# Patient Record
Sex: Male | Born: 1965
Health system: Southern US, Community
[De-identification: ages and names within clinical notes are randomized; demographics above are authoritative.]

---

## 1987-05-12 HISTORY — PX: OTHER SURGICAL HISTORY: SHX169

## 2008-10-19 ENCOUNTER — Emergency Department (HOSPITAL_COMMUNITY): Admission: EM | Admit: 2008-10-19 | Discharge: 2008-10-19 | Payer: Self-pay | Admitting: Emergency Medicine

## 2010-08-18 LAB — URINE MICROSCOPIC-ADD ON

## 2010-08-18 LAB — CBC
HCT: 48.1 % (ref 39.0–52.0)
MCHC: 34.5 g/dL (ref 30.0–36.0)
MCV: 88.7 fL (ref 78.0–100.0)
RBC: 5.43 MIL/uL (ref 4.22–5.81)
WBC: 11.1 10*3/uL — ABNORMAL HIGH (ref 4.0–10.5)

## 2010-08-18 LAB — DIFFERENTIAL
Basophils Absolute: 0 10*3/uL (ref 0.0–0.1)
Lymphocytes Relative: 7 % — ABNORMAL LOW (ref 12–46)
Lymphs Abs: 0.7 10*3/uL (ref 0.7–4.0)
Neutro Abs: 9.8 10*3/uL — ABNORMAL HIGH (ref 1.7–7.7)
Neutrophils Relative %: 88 % — ABNORMAL HIGH (ref 43–77)

## 2010-08-18 LAB — PROTIME-INR
INR: 1.1 (ref 0.00–1.49)
Prothrombin Time: 14.1 seconds (ref 11.6–15.2)

## 2010-08-18 LAB — COMPREHENSIVE METABOLIC PANEL
AST: 27 U/L (ref 0–37)
BUN: 20 mg/dL (ref 6–23)
CO2: 27 mEq/L (ref 19–32)
Calcium: 9.6 mg/dL (ref 8.4–10.5)
Chloride: 107 mEq/L (ref 96–112)
Creatinine, Ser: 1 mg/dL (ref 0.4–1.5)
GFR calc Af Amer: >60 mL/min (ref >60)
GFR calc non Af Amer: >60 mL/min (ref >60)
Total Bilirubin: 1.1 mg/dL (ref 0.3–1.2)

## 2010-08-18 LAB — URINALYSIS, ROUTINE W REFLEX MICROSCOPIC
Glucose, UA: NEGATIVE mg/dL
Ketones, ur: >80 mg/dL — AB
Nitrite: NEGATIVE
pH: 6.5 (ref 5.0–8.0)

## 2012-04-19 ENCOUNTER — Other Ambulatory Visit: Payer: Self-pay | Admitting: Internal Medicine

## 2012-04-19 ENCOUNTER — Ambulatory Visit
Admission: RE | Admit: 2012-04-19 | Discharge: 2012-04-19 | Disposition: A | Discharge status: Home / Self Care | Payer: BC Managed Care – PPO | Source: Ambulatory Visit | Attending: Internal Medicine | Admitting: Internal Medicine

## 2012-04-19 DIAGNOSIS — M25511 Pain in right shoulder: Secondary | ICD-10-CM

## 2012-04-22 ENCOUNTER — Other Ambulatory Visit: Payer: BC Managed Care – PPO

## 2012-04-25 ENCOUNTER — Ambulatory Visit
Payer: BC Managed Care – PPO | Attending: Internal Medicine | Admitting: Rehabilitative and Restorative Service Providers"

## 2012-04-25 DIAGNOSIS — M25619 Stiffness of unspecified shoulder, not elsewhere classified: Secondary | ICD-10-CM | POA: Insufficient documentation

## 2012-04-25 DIAGNOSIS — IMO0001 Reserved for inherently not codable concepts without codable children: Secondary | ICD-10-CM | POA: Insufficient documentation

## 2012-04-25 DIAGNOSIS — M25519 Pain in unspecified shoulder: Secondary | ICD-10-CM | POA: Insufficient documentation

## 2012-04-28 ENCOUNTER — Ambulatory Visit: Payer: BC Managed Care – PPO | Admitting: Physical Therapy

## 2012-05-05 ENCOUNTER — Ambulatory Visit: Payer: BC Managed Care – PPO

## 2012-05-05 ENCOUNTER — Ambulatory Visit: Payer: BC Managed Care – PPO | Admitting: Rehabilitation

## 2012-05-09 ENCOUNTER — Ambulatory Visit: Payer: BC Managed Care – PPO | Admitting: Rehabilitation

## 2012-05-12 ENCOUNTER — Ambulatory Visit: Payer: BC Managed Care – PPO | Admitting: Rehabilitative and Restorative Service Providers"

## 2012-05-12 ENCOUNTER — Ambulatory Visit: Payer: BC Managed Care – PPO | Admitting: Rehabilitation

## 2012-05-12 ENCOUNTER — Encounter: Payer: BC Managed Care – PPO | Admitting: Rehabilitation

## 2012-09-01 ENCOUNTER — Ambulatory Visit (INDEPENDENT_AMBULATORY_CARE_PROVIDER_SITE_OTHER): Payer: BC Managed Care – PPO | Admitting: Sports Medicine

## 2012-09-01 VITALS — BP 100/60 | Ht 69.0 in | Wt 150.0 lb

## 2012-09-01 DIAGNOSIS — M75101 Unspecified rotator cuff tear or rupture of right shoulder, not specified as traumatic: Secondary | ICD-10-CM

## 2012-09-01 DIAGNOSIS — M25519 Pain in unspecified shoulder: Secondary | ICD-10-CM

## 2012-09-01 DIAGNOSIS — M25511 Pain in right shoulder: Secondary | ICD-10-CM

## 2012-09-01 DIAGNOSIS — S43429A Sprain of unspecified rotator cuff capsule, initial encounter: Secondary | ICD-10-CM

## 2012-09-01 MED ORDER — NITROGLYCERIN 0.2 MG/HR TD PT24: MEDICATED_PATCH | TRANSDERMAL | Status: DC

## 2012-09-01 NOTE — Patient Instructions (Addendum)
Nitroglycerin Protocol   Apply 1/4 nitroglycerin patch to affected area daily.  Change position of patch within the affected area every 24 hours.  You may experience a headache during the first 1-2 weeks of using the patch, these should subside.  If you experience headaches after beginning nitroglycerin patch treatment, you may take your preferred over the counter pain reliever.  Another side effect of the nitroglycerin patch is skin irritation or rash related to patch adhesive.  Please notify our office if you develop more severe headaches or rash, and stop the patch.  Tendon healing with nitroglycerin patch may require 12 to 24 weeks depending on the extent of injury.  Men should not use if taking Viagra, Cialis, or Levitra.   Do not use if you have migraines or rosacea.    Please do suggested exercises daily  It is ok to continue playing tennis Try not to hit your serve or overheads too hard, try not to hit forehand late   Please follow up in 6 weeks  Thank you for seeing [place] today!

## 2012-09-06 DIAGNOSIS — M25519 Pain in unspecified shoulder: Secondary | ICD-10-CM | POA: Insufficient documentation

## 2012-09-06 DIAGNOSIS — M75100 Unspecified rotator cuff tear or rupture of unspecified shoulder, not specified as traumatic: Secondary | ICD-10-CM | POA: Insufficient documentation

## 2012-09-06 NOTE — Assessment & Plan Note (Signed)
Try on NTG protocol This looks to have potential for full healing  Avoid activities likely to worsen this - tennis serve; overhead lifts  rescan in 6 weeks

## 2012-09-06 NOTE — Assessment & Plan Note (Signed)
Use prn NSAIDs  HEP to maintain RC strength with low weights

## 2012-09-06 NOTE — Progress Notes (Signed)
Patient ID: Steven Andrade, male   DOB: Oct 23, 1965, 47 y.o.   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 Palpation is normal with no tenderness over AC joint or bicipital groove. ROM is full in all planes. Rotator cuff strength normal throughout except SUPRASPINATUS  3 + signs of impingement with pain on Neer and Hawkin's tests, empty can. Empty can is weak  Speeds and Yergason's tests normal. No labral pathology noted with negative Obrien's, negative clunk and good stability. Normal scapular function observed. No painful arc and no drop arm sign. No apprehension sign  MSK [place] There is a small tear in Supraspinatus Tendon of about 0.5 cm length This looks like partial thickness Other RC and Bicipital tendons are normal AC jont shows mild DJD

## 2012-09-20 ENCOUNTER — Encounter: Payer: Self-pay | Admitting: Oncology

## 2012-10-11 ENCOUNTER — Ambulatory Visit (INDEPENDENT_AMBULATORY_CARE_PROVIDER_SITE_OTHER): Payer: BC Managed Care – PPO | Admitting: Sports Medicine

## 2012-10-11 VITALS — BP 120/70 | Ht 69.0 in | Wt 150.0 lb

## 2012-10-11 DIAGNOSIS — M25511 Pain in right shoulder: Secondary | ICD-10-CM

## 2012-10-11 DIAGNOSIS — M25519 Pain in unspecified shoulder: Secondary | ICD-10-CM

## 2012-10-11 DIAGNOSIS — S43429A Sprain of unspecified rotator cuff capsule, initial encounter: Secondary | ICD-10-CM

## 2012-10-11 DIAGNOSIS — M75101 Unspecified rotator cuff tear or rupture of right shoulder, not specified as traumatic: Secondary | ICD-10-CM

## 2012-10-11 NOTE — Assessment & Plan Note (Signed)
This is showing significant signs of healing  Keep up nitroglycerin protocol for 6 more weeks  Keep up home exercise program  Okay to play tennis but ease into serving  Recheck with repeat scan in 6 weeks

## 2012-10-11 NOTE — Assessment & Plan Note (Signed)
Pain is much less and he is using very little ibuprofen

## 2012-10-11 NOTE — Progress Notes (Signed)
Patient ID: Steven Andrade, male   DOB: 1965/11/14, 47 y.o.   MRN: [id number]  Patient is seen 6 weeks after a rotator cuff tear involving the supraspinatous tendon on the right This looked to be a partial articular side tear He was started on home exercises Started on nitroglycerin protocol He has felt substantially better He is actually playing tennis he is just being careful on the serve and the overhead No night pain Feels that he has much better strength  Feels that he is at least 60% better  Physical examination  No acute distress Shoulder: Inspection reveals no abnormalities, atrophy or asymmetry. Palpation is normal with no tenderness over AC joint or bicipital groove. ROM is full in all planes. Rotator cuff strength normal throughout. No signs of impingement with negative Neer and Hawkin's tests, empty can. Speeds and Yergason's tests normal. No labral pathology noted with negative Obrien's, negative clunk and good stability. Normal scapular function observed. No painful arc and no drop arm sign. No apprehension sign  MSK ultrasound Compared to last visit there is a reduction about 40% in the width of the small tear This measures about 0.5 cm in length This looks less hypoechoic On dynamic imaging I do not see any gapping of the tear

## 2012-11-23 ENCOUNTER — Ambulatory Visit (INDEPENDENT_AMBULATORY_CARE_PROVIDER_SITE_OTHER): Payer: BC Managed Care – PPO | Admitting: Sports Medicine

## 2012-11-23 VITALS — BP 115/73 | Ht 68.0 in | Wt 142.0 lb

## 2012-11-23 DIAGNOSIS — S43429A Sprain of unspecified rotator cuff capsule, initial encounter: Secondary | ICD-10-CM

## 2012-11-23 DIAGNOSIS — M25519 Pain in unspecified shoulder: Secondary | ICD-10-CM

## 2012-11-23 DIAGNOSIS — M75101 Unspecified rotator cuff tear or rupture of right shoulder, not specified as traumatic: Secondary | ICD-10-CM

## 2012-11-23 DIAGNOSIS — M25511 Pain in right shoulder: Secondary | ICD-10-CM

## 2012-11-23 NOTE — Progress Notes (Signed)
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 Patient has been using the nitroglycerin protocol for 3 months now. He feels like he has had significant improvement at this time. He is actually feeling great today which he attributes to not playing tennis on Tuesday. He states that his instructor has pointed out some issues with his serving technique that likely led to his initial problem. On preliminary ultrasound, patient was found to have a partial tear of the supraspinatus tendon. He feels like his strength and range of motion is improving and he has had less pain  ROS: As above in the HPI. All other systems are stable or negative.  OBJECTIVE: APPEARANCE:  Patient in no acute distress.The patient appeared well nourished and normally developed. Respirations nonlabored. No rash HEENT: No scleral icterus. Conjunctiva non-injected MSK:  Right Shoulder - No swelling or deformity - No TTP over AC joint, biceps tendon, or lateral humerus - FROM in flexion, abduction, internal, external rotation - Strength 5/5 on shoulder abduction, internal rotation, external rotation, empty can - Negative empty can, Hawkin's, Neer's for impingement  Musculoskeletal ultrasound: Right shoulder: Previously visualized partial tear in the right supraspinatus tendon has now healed. There is good continuity of tendon fibers. Biceps tendon, infraspinatus, teres minor, subscapularis also visualized and healthy-appearing. There is no impingement on dynamic testing of the supraspinatus or subscapularis.  ASSESSMENT:  #1. Right supraspinatus partial tear, evidence of good healing on musculoskeletal exam in patient with clinical improvement  PLAN: Patient appears to have significant improvement at this time. He may discontinue the nitroglycerin protocol. Encouraged  him to continue his home exercises. Continue to work on improving tennis technique as this was likely the etiology of his injury.

## 2012-11-23 NOTE — Patient Instructions (Signed)
Decrease nitroglycerin to every other day until you run out and then stop. Continue to work on Engineer, site.

## 2012-11-24 NOTE — Assessment & Plan Note (Signed)
Pain level rarely above 2/10 now and only w tennis

## 2012-11-24 NOTE — Assessment & Plan Note (Signed)
apears healed with HEP and NTG  Reck prn

## 2014-02-06 ENCOUNTER — Other Ambulatory Visit: Payer: Self-pay | Admitting: Urology

## 2014-03-02 ENCOUNTER — Encounter (HOSPITAL_BASED_OUTPATIENT_CLINIC_OR_DEPARTMENT_OTHER): Payer: No Typology Code available for payment source | Admitting: Anesthesiology

## 2014-03-02 ENCOUNTER — Encounter (HOSPITAL_BASED_OUTPATIENT_CLINIC_OR_DEPARTMENT_OTHER)
Admission: RE | Disposition: A | Discharge status: Home / Self Care | Payer: Self-pay | Source: Ambulatory Visit | Attending: Urology

## 2014-03-02 ENCOUNTER — Ambulatory Visit (HOSPITAL_BASED_OUTPATIENT_CLINIC_OR_DEPARTMENT_OTHER): Payer: No Typology Code available for payment source | Admitting: Anesthesiology

## 2014-03-02 ENCOUNTER — Ambulatory Visit (HOSPITAL_BASED_OUTPATIENT_CLINIC_OR_DEPARTMENT_OTHER)
Admission: RE | Admit: 2014-03-02 | Discharge: 2014-03-02 | Disposition: A | Discharge status: Home / Self Care | Payer: No Typology Code available for payment source | Source: Ambulatory Visit | Attending: Urology | Admitting: Urology

## 2014-03-02 ENCOUNTER — Encounter (HOSPITAL_BASED_OUTPATIENT_CLINIC_OR_DEPARTMENT_OTHER): Payer: Self-pay

## 2014-03-02 DIAGNOSIS — N471 Phimosis: Secondary | ICD-10-CM | POA: Insufficient documentation

## 2014-03-02 HISTORY — PX: CIRCUMCISION: SHX1350

## 2014-03-02 LAB — POCT HEMOGLOBIN-HEMACUE: Hemoglobin: 18.2 g/dL — ABNORMAL HIGH (ref 13.0–17.0)

## 2014-03-02 SURGERY — CIRCUMCISION, ADULT
Anesthesia: Monitor Anesthesia Care | Site: Penis

## 2014-03-02 MED ORDER — CEFAZOLIN SODIUM-DEXTROSE 2-3 GM-% IV SOLR
2.0000 g | INTRAVENOUS | Status: AC
  Administered 2014-03-02: 2 g via INTRAVENOUS
  Filled 2014-03-02: qty 50

## 2014-03-02 MED ORDER — FENTANYL CITRATE 0.05 MG/ML IJ SOLN
INTRAMUSCULAR | Status: AC
  Filled 2014-03-02: qty 6

## 2014-03-02 MED ORDER — HYDROCODONE-ACETAMINOPHEN 5-325 MG PO TABS: 1.0000 | ORAL_TABLET | Freq: Four times a day (QID) | ORAL | Status: AC | PRN

## 2014-03-02 MED ORDER — CEFAZOLIN SODIUM-DEXTROSE 2-3 GM-% IV SOLR
INTRAVENOUS | Status: AC
  Filled 2014-03-02: qty 50

## 2014-03-02 MED ORDER — PROPOFOL 10 MG/ML IV EMUL
INTRAVENOUS | Status: DC | PRN
  Administered 2014-03-02: 120 ug/kg/min via INTRAVENOUS

## 2014-03-02 MED ORDER — FENTANYL CITRATE 0.05 MG/ML IJ SOLN
25.0000 ug | INTRAMUSCULAR | Status: DC | PRN
  Filled 2014-03-02: qty 1

## 2014-03-02 MED ORDER — LACTATED RINGERS IV SOLN
INTRAVENOUS | Status: DC
  Administered 2014-03-02: 07:00:00 via INTRAVENOUS
  Filled 2014-03-02: qty 1000

## 2014-03-02 MED ORDER — MIDAZOLAM HCL 5 MG/5ML IJ SOLN
INTRAMUSCULAR | Status: DC | PRN
  Administered 2014-03-02: 2 mg via INTRAVENOUS

## 2014-03-02 MED ORDER — BUPIVACAINE HCL (PF) 0.25 % IJ SOLN
INTRAMUSCULAR | Status: DC | PRN
  Administered 2014-03-02: 6.5 mL

## 2014-03-02 MED ORDER — MIDAZOLAM HCL 2 MG/2ML IJ SOLN
INTRAMUSCULAR | Status: AC
  Filled 2014-03-02: qty 2

## 2014-03-02 MED ORDER — LIDOCAINE HCL 1 % IJ SOLN
INTRAMUSCULAR | Status: DC | PRN
  Administered 2014-03-02: 6.5 mL

## 2014-03-02 MED ORDER — PROMETHAZINE HCL 25 MG/ML IJ SOLN
6.2500 mg | INTRAMUSCULAR | Status: DC | PRN
  Filled 2014-03-02: qty 1

## 2014-03-02 MED ORDER — BACITRACIN ZINC 500 UNIT/GM EX OINT
TOPICAL_OINTMENT | CUTANEOUS | Status: DC | PRN
  Administered 2014-03-02: 1 via TOPICAL

## 2014-03-02 MED ORDER — LIDOCAINE HCL (CARDIAC) 20 MG/ML IV SOLN
INTRAVENOUS | Status: DC | PRN
  Administered 2014-03-02: 50 mg via INTRAVENOUS

## 2014-03-02 SURGICAL SUPPLY — 33 items
BANDAGE CO FLEX L/F 1IN X 5YD (GAUZE/BANDAGES/DRESSINGS) ×2 IMPLANT
BANDAGE COBAN STERILE 2 (GAUZE/BANDAGES/DRESSINGS) ×3 IMPLANT
BLADE SURG 15 STRL LF DISP TIS (BLADE) ×1 IMPLANT
BLADE SURG 15 STRL SS (BLADE) ×3
CLOTH BEACON ORANGE TIMEOUT ST (SAFETY) ×3 IMPLANT
COVER MAYO STAND STRL (DRAPES) ×3 IMPLANT
COVER TABLE BACK 60X90 (DRAPES) ×3 IMPLANT
DRAPE PED LAPAROTOMY (DRAPES) ×3 IMPLANT
DRSG TEGADERM 2-3/8X2-3/4 SM (GAUZE/BANDAGES/DRESSINGS) IMPLANT
DRSG VASELINE 3X18 (GAUZE/BANDAGES/DRESSINGS) IMPLANT
ELECT NDL TIP 2.8 STRL (NEEDLE) ×1 IMPLANT
ELECT NEEDLE TIP 2.8 STRL (NEEDLE) ×3 IMPLANT
ELECT REM PT RETURN 9FT ADLT (ELECTROSURGICAL) ×3
ELECTRODE REM PT RTRN 9FT ADLT (ELECTROSURGICAL) ×1 IMPLANT
GAUZE PETROLATUM 1 X8 (GAUZE/BANDAGES/DRESSINGS) ×3 IMPLANT
GAUZE SPONGE 4X4 12PLY STRL LF (GAUZE/BANDAGES/DRESSINGS) IMPLANT
GAUZE VASELINE 1X8 (GAUZE/BANDAGES/DRESSINGS) ×2 IMPLANT
GLOVE BIO SURGEON STRL SZ7.5 (GLOVE) ×3 IMPLANT
GOWN STRL REUS W/ TWL XL LVL3 (GOWN DISPOSABLE) IMPLANT
GOWN STRL REUS W/TWL XL LVL3 (GOWN DISPOSABLE) ×6
GOWN W/2 COTTON TOWELS 2 STD (GOWNS) IMPLANT
NDL HYPO 25X1 1.5 SAFETY (NEEDLE) ×1 IMPLANT
NEEDLE HYPO 25X1 1.5 SAFETY (NEEDLE) ×3 IMPLANT
NS IRRIG 500ML POUR BTL (IV SOLUTION) ×3 IMPLANT
PACK BASIN DAY SURGERY FS (CUSTOM PROCEDURE TRAY) ×3 IMPLANT
PENCIL BUTTON HOLSTER BLD 10FT (ELECTRODE) ×3 IMPLANT
SUT VIC AB 5-0 P-3 18X BRD (SUTURE) ×1 IMPLANT
SUT VIC AB 5-0 P3 18 (SUTURE) ×3
SUT VICRYL 4-0 PS2 18IN ABS (SUTURE) ×7 IMPLANT
SYR CONTROL 10ML LL (SYRINGE) ×3 IMPLANT
TOWEL OR 17X24 6PK STRL BLUE (TOWEL DISPOSABLE) ×6 IMPLANT
TRAY DSU PREP LF (CUSTOM PROCEDURE TRAY) ×3 IMPLANT
WATER STERILE IRR 500ML POUR (IV SOLUTION) IMPLANT

## 2014-03-02 NOTE — Discharge Instructions (Signed)
Postoperative instructions for circumcision ° °Wound: ° °Remove the dressing the morning after surgery. °In most cases your incision will have absorbable sutures that run along the course of your incision and will dissolve within the first 10-20 days. Some will fall out even earlier. Expect some redness as the sutures dissolved but this should occur only around the sutures. If there is generalized redness, especially with increasing pain or swelling, let us know. The penis will very likely get "black and blue" as the blood in the tissues spread. Sometimes the whole penis will turn colors. The black and blue is followed by a yellow and brown color. In time, all the discoloration will go away. ° °Diet: ° °You may return to your normal diet within 24 hours following your surgery. You may note some mild nausea and possibly vomiting the first 6-8 hours following surgery. This is usually due to the side effects of anesthesia, and will disappear quite soon. I would suggest clear liquids and a very light meal the first evening following your surgery. ° °Activity: ° °Your physical activity should be restricted the first 48 hours. During that time you should remain relatively inactive, moving about only when necessary. During the first 7-10 days following surgery he should avoid lifting any heavy objects (anything greater than 15 pounds), and avoid strenuous exercise. If you work, ask us specifically about your restrictions, both for work and home. We will write a note to your employer if needed. ° °Ice packs can be placed on and off over the penis for the first 48 hours to help relieve the pain and keep the swelling down. Frozen peas or corn in a ZipLock bag can be frozen, used and re-frozen. Fifteen minutes on and 15 minutes off is a reasonable schedule.  °No sexual activity for 1 month. ° °Hygiene: ° °You may shower 48 hours after your surgery. Tub bathing should be restricted until the seventh day. ° °Medication: ° °You  will be sent home with some type of pain medication. In many cases you will be sent home with a narcotic pain pill (Vicodin or Tylox). If the pain is not too bad, you may take either Tylenol (acetaminophen) or Advil (ibuprofen) which contain no narcotic agents, and might be tolerated a little better, with fewer side effects. If the pain medication you are sent home with does not control the pain, you will have to let us know. Some narcotic pain medications cannot be given or refilled by a phone call to a pharmacy. ° °Problems you should report to us: ° °· Fever of 101.0 degrees Fahrenheit or greater. °· Moderate or severe swelling under the skin incision or involving the scrotum. °· Drug reaction such as hives, a rash, nausea or vomiting.  ° ° °Post Anesthesia Home Care Instructions ° °Activity: °Get plenty of rest for the remainder of the day. A responsible adult should stay with you for 24 hours following the procedure.  °For the next 24 hours, DO NOT: °-Drive a car °-Operate machinery °-Drink alcoholic beverages °-Take any medication unless instructed by your physician °-Make any legal decisions or sign important papers. ° °Meals: °Start with liquid foods such as gelatin or soup. Progress to regular foods as tolerated. Avoid greasy, spicy, heavy foods. If nausea and/or vomiting occur, drink only clear liquids until the nausea and/or vomiting subsides. Call your physician if vomiting continues. ° °Special Instructions/Symptoms: °Your throat may feel dry or sore from the anesthesia or the breathing tube placed in your throat   throat during surgery. If this causes discomfort, gargle with warm salt water. The discomfort should disappear within 24 hours.

## 2014-03-02 NOTE — Anesthesia Preprocedure Evaluation (Signed)
Anesthesia Evaluation  Patient identified by MRN, date of birth, ID band Patient awake    Reviewed: Allergy & Precautions, H&P , NPO status , Patient's Chart, lab work & pertinent test results  Airway Mallampati: II TM Distance: >3 FB Neck ROM: Full    Dental no notable dental hx.    Pulmonary neg pulmonary ROS,  breath sounds clear to auscultation  Pulmonary exam normal       Cardiovascular negative cardio ROS  Rhythm:Regular Rate:Normal     Neuro/Psych negative neurological ROS  negative psych ROS   GI/Hepatic negative GI ROS, Neg liver ROS,   Endo/Other  negative endocrine ROS  Renal/GU negative Renal ROS  negative genitourinary   Musculoskeletal negative musculoskeletal ROS (+)   Abdominal   Peds negative pediatric ROS (+)  Hematology negative hematology ROS (+)   Anesthesia Other Findings   Reproductive/Obstetrics negative OB ROS                           Anesthesia Physical Anesthesia Plan  ASA: I  Anesthesia Plan: MAC   Post-op Pain Management:    Induction: Intravenous  Airway Management Planned:   Additional Equipment:   Intra-op Plan:   Post-operative Plan: Extubation in OR  Informed Consent: I have reviewed the patients History and Physical, chart, labs and discussed the procedure including the risks, benefits and alternatives for the proposed anesthesia with the patient or authorized representative who has indicated his/her understanding and acceptance.   Dental advisory given  Plan Discussed with: CRNA  Anesthesia Plan Comments: (MAC with GA backup)        Anesthesia Quick Evaluation

## 2014-03-02 NOTE — Interval H&P Note (Signed)
History and Physical Interval Note:  03/02/2014 7:35 AM  Steven Andrade  has presented today for surgery, with the diagnosis of PHIMOSIS  The various methods of treatment have been discussed with the patient and family. After consideration of risks, benefits and other options for treatment, the patient has consented to  Procedure(s): CIRCUMCISION ADULT (N/A) as a surgical intervention .  The patient's history has been reviewed, patient examined, no change in status, stable for surgery.  I have reviewed the patient's chart and labs.  Questions were answered to the patient's satisfaction.     Irean Kendricks S

## 2014-03-02 NOTE — Op Note (Signed)
Preoperative diagnosis: Phimosis Postoperative diagnosis: Same  Procedure: Circumcision Surgeon: Valetta Fulleravid S. Delorean Knutzen M.D. Anesthesia: MAC with penile block  Indications: The patient was recently evaluated for phimosis. All risks and benefits of circumcision discussed. Full informed consent obtained. The patient now presents for definitive procedure.  Technique and findings: The patient was brought to the operating room. MAC anesthesia was initiated.  A penile block was then performed with 15 cc of quarter percent Marcaine. The patient was then prepped and draped in usual manner. Appropriate surgical timeout was performed. A circumferential incision was made behind the glans penis. The foreskin was then retracted and a second circumferential incision was made within the mucosal collar. A very tight and thick frenular band was incised. The frenulum was then reconstructed with interrupted 5-0 Vicryl suture. The sleeve of redundant skin was removed. Skin edges were reapproximated with interrupted 4-0 Vicryl suture. The incision was wrapped with Xeroform gauze with bacitracin ointment. A Coban dressing was loosely wrapped. The patient was brought to recovery room in stable condition having had no obvious complications or problems. Sponge and needle counts were correct.

## 2014-03-02 NOTE — Transfer of Care (Signed)
Immediate Anesthesia Transfer of Care Note  Patient: Steven Andrade  Procedure(s) Performed: Procedure(s) (LRB): CIRCUMCISION ADULT (N/A)  Patient Location: PACU  Anesthesia Type: MAC  Level of Consciousness: awake, alert , oriented and patient cooperative  Airway & Oxygen Therapy: Patient Spontanous Breathing and Patient connected to face mask oxygen  Post-op Assessment: Report given to PACU RN and Post -op Vital signs reviewed and stable  Post vital signs: Reviewed and stable  Complications: No apparent anesthesia complications

## 2014-03-02 NOTE — H&P (Signed)
  History of Present Illness Steven Andrade presents today with progressive difficulty retracting his foreskin. No voiding issues.   Current Meds 1. No Reported Medications Recorded  Allergies No Known Allergies  1. No Known Allergies  Review of Systems Genitourinary, constitutional, skin, eye, otolaryngeal, hematologic/lymphatic, cardiovascular, pulmonary, endocrine, musculoskeletal, gastrointestinal, neurological and psychiatric system(s) were reviewed and pertinent findings if present are noted.    Vitals Vital Signs [Data Includes: Last 1 Day]  Recorded: 28Sep2015 10:06AM  Height: 5 ft 8 in Weight: 150 lb  BMI Calculated: 22.81 BSA Calculated: 1.81 Blood Pressure: 134 / 82 Temperature: 97.6 F Heart Rate: 63  Physical Exam Constitutional: Well nourished and well developed . No acute distress.  Neck: The appearance of the neck is normal and no neck mass is present.  Pulmonary: No respiratory distress and normal respiratory rhythm and effort.  Cardiovascular: Heart rate and rhythm are normal . No peripheral edema.  Abdomen: The abdomen is soft and nontender. No masses are palpated. No CVA tenderness. No hernias are palpable. No hepatosplenomegaly noted.  Genitourinary: Examination of the penis demonstrates phimosis, but no balanitis. The penis is uncircumcised.  Skin: Normal skin turgor, no visible rash and no visible skin lesions.  Neuro/Psych:. Mood and affect are appropriate.    Results/Data Urine [Data Includes: Last 1 Day]   28Sep2015  COLOR YELLOW   APPEARANCE CLEAR   SPECIFIC GRAVITY 1.020   pH 5.0   GLUCOSE NEG mg/dL  BILIRUBIN NEG   KETONE NEG mg/dL  BLOOD TRACE   PROTEIN NEG mg/dL  UROBILINOGEN 0.2 mg/dL  NITRITE NEG   LEUKOCYTE ESTERASE NEG   SQUAMOUS EPITHELIAL/HPF NONE SEEN   WBC 0-2 WBC/hpf  RBC 0-2 RBC/hpf  BACTERIA RARE   CRYSTALS NONE SEEN   CASTS NONE SEEN    Assessment Assessed  1. Phimosis (605)  Plan Health Maintenance  1. UA With REFLEX;  [Do Not Release]; Status:Resulted - Requires Verification;   Done:  28Sep2015 09:57AM  Discussion/Summary   Steven Andrade has had some progressive phimosis. There is currently no evidence of balanitis. I am unable to retract his foreskin. He also appears to have a frenula attachment. At this point my recommendation is to go ahead with circumcision. We discussed that procedure. I would recommend this be done with IV sedation and a penile block. He would prefer a Friday morning, so we will see what we can do to get that set up for sometime in the next few weeks. Of note, he did have 1 episode of renal colic in the past. This was associated with taking some calcium supplements and being dehydrated. A very small ureteral stone was passed and on CT imaging, there is no other renal calculi noted.   Signatures Electronically signed by : Barron Alvineavid Kal Chait, M.D.; Feb 05 2014  5:08PM EST

## 2014-03-02 NOTE — Anesthesia Postprocedure Evaluation (Signed)
  Anesthesia Post-op Note  Patient: Steven Andrade  Procedure(s) Performed: Procedure(s) (LRB): CIRCUMCISION ADULT (N/A)  Patient Location: PACU  Anesthesia Type: MAC  Level of Consciousness: awake and alert   Airway and Oxygen Therapy: Patient Spontanous Breathing  Post-op Pain: mild  Post-op Assessment: Post-op Vital signs reviewed, Patient's Cardiovascular Status Stable, Respiratory Function Stable, Patent Airway and No signs of Nausea or vomiting  Last Vitals:  Filed Vitals:   03/02/14 0921  BP: 116/66  Pulse: 58  Temp: 36.4 C  Resp: 16    Post-op Vital Signs: stable   Complications: No apparent anesthesia complications

## 2014-03-05 ENCOUNTER — Encounter (HOSPITAL_BASED_OUTPATIENT_CLINIC_OR_DEPARTMENT_OTHER): Payer: Self-pay | Admitting: Urology

## 2014-05-27 IMAGING — CR DG SHOULDER 2+V*R*
3 series · 3 of 3 positions shown · non-contrast
Comparison: None.

CLINICAL DATA: Right shoulder pain

RIGHT SHOULDER - 2+ VIEW

[view not recorded (1 of 3)]
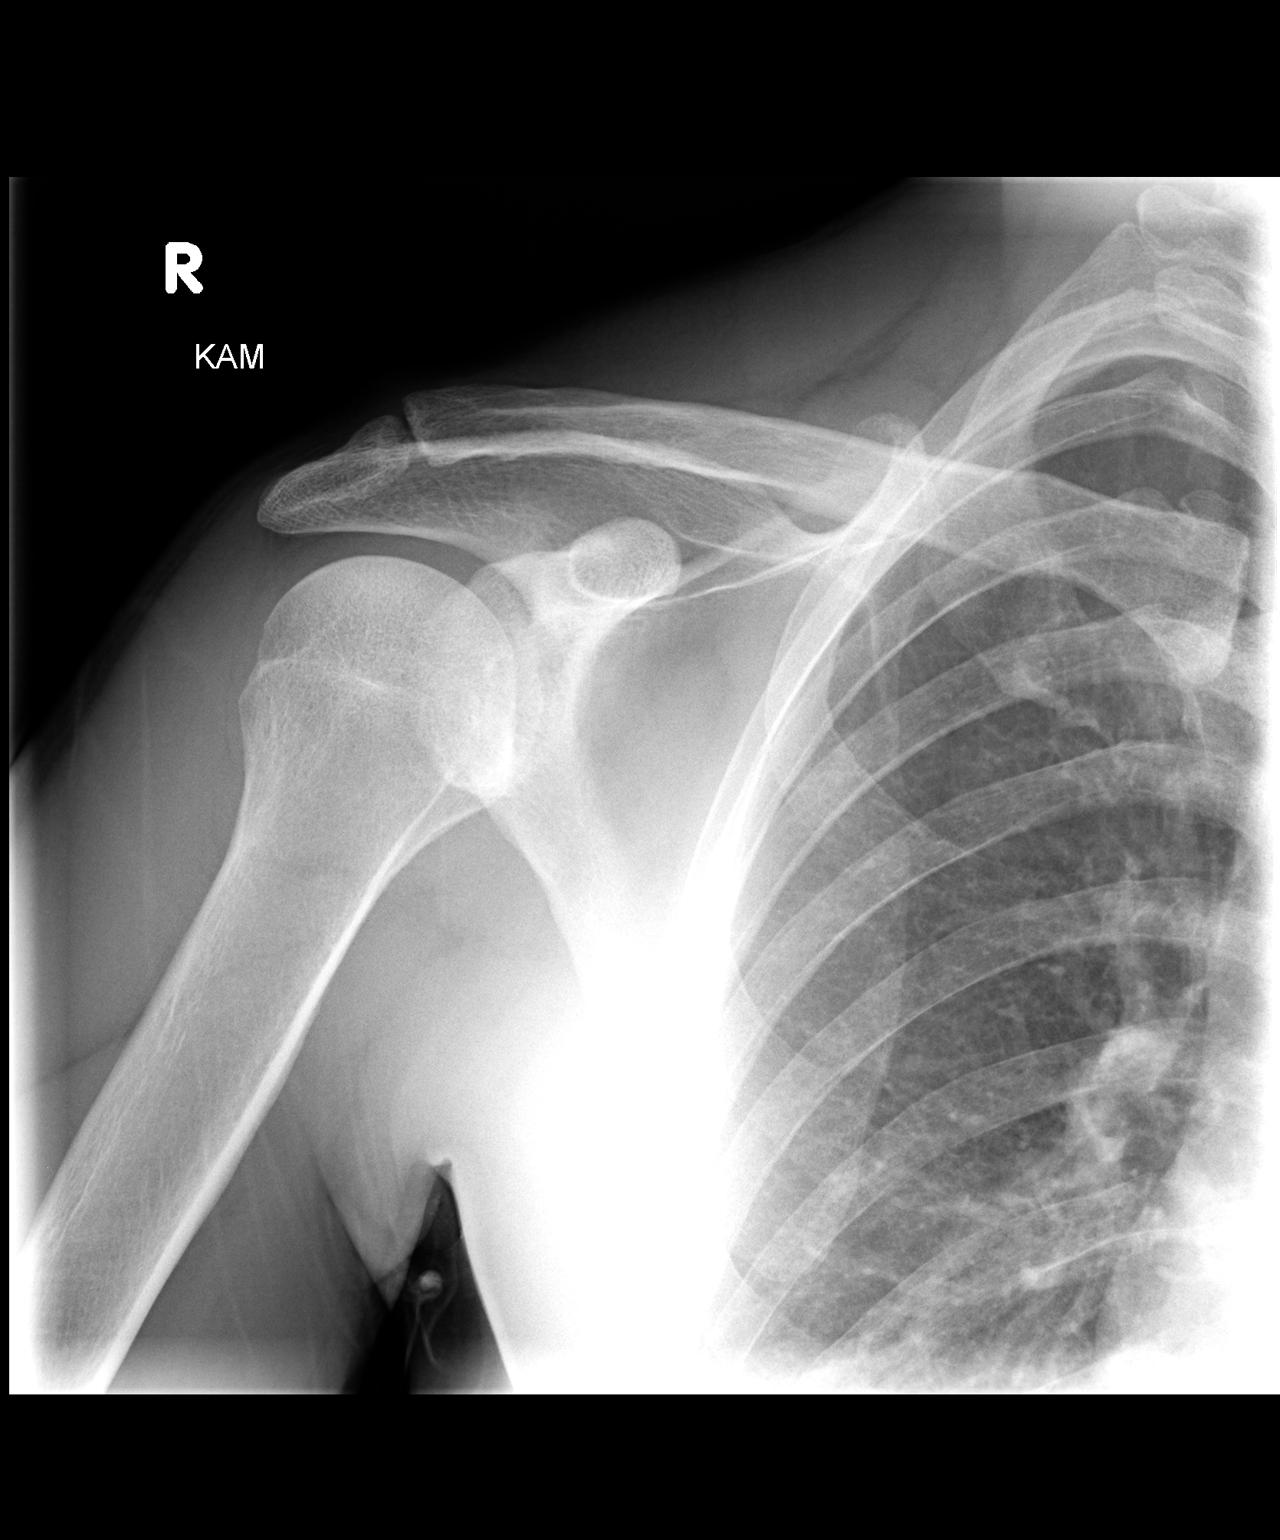

[view not recorded (2 of 3)]
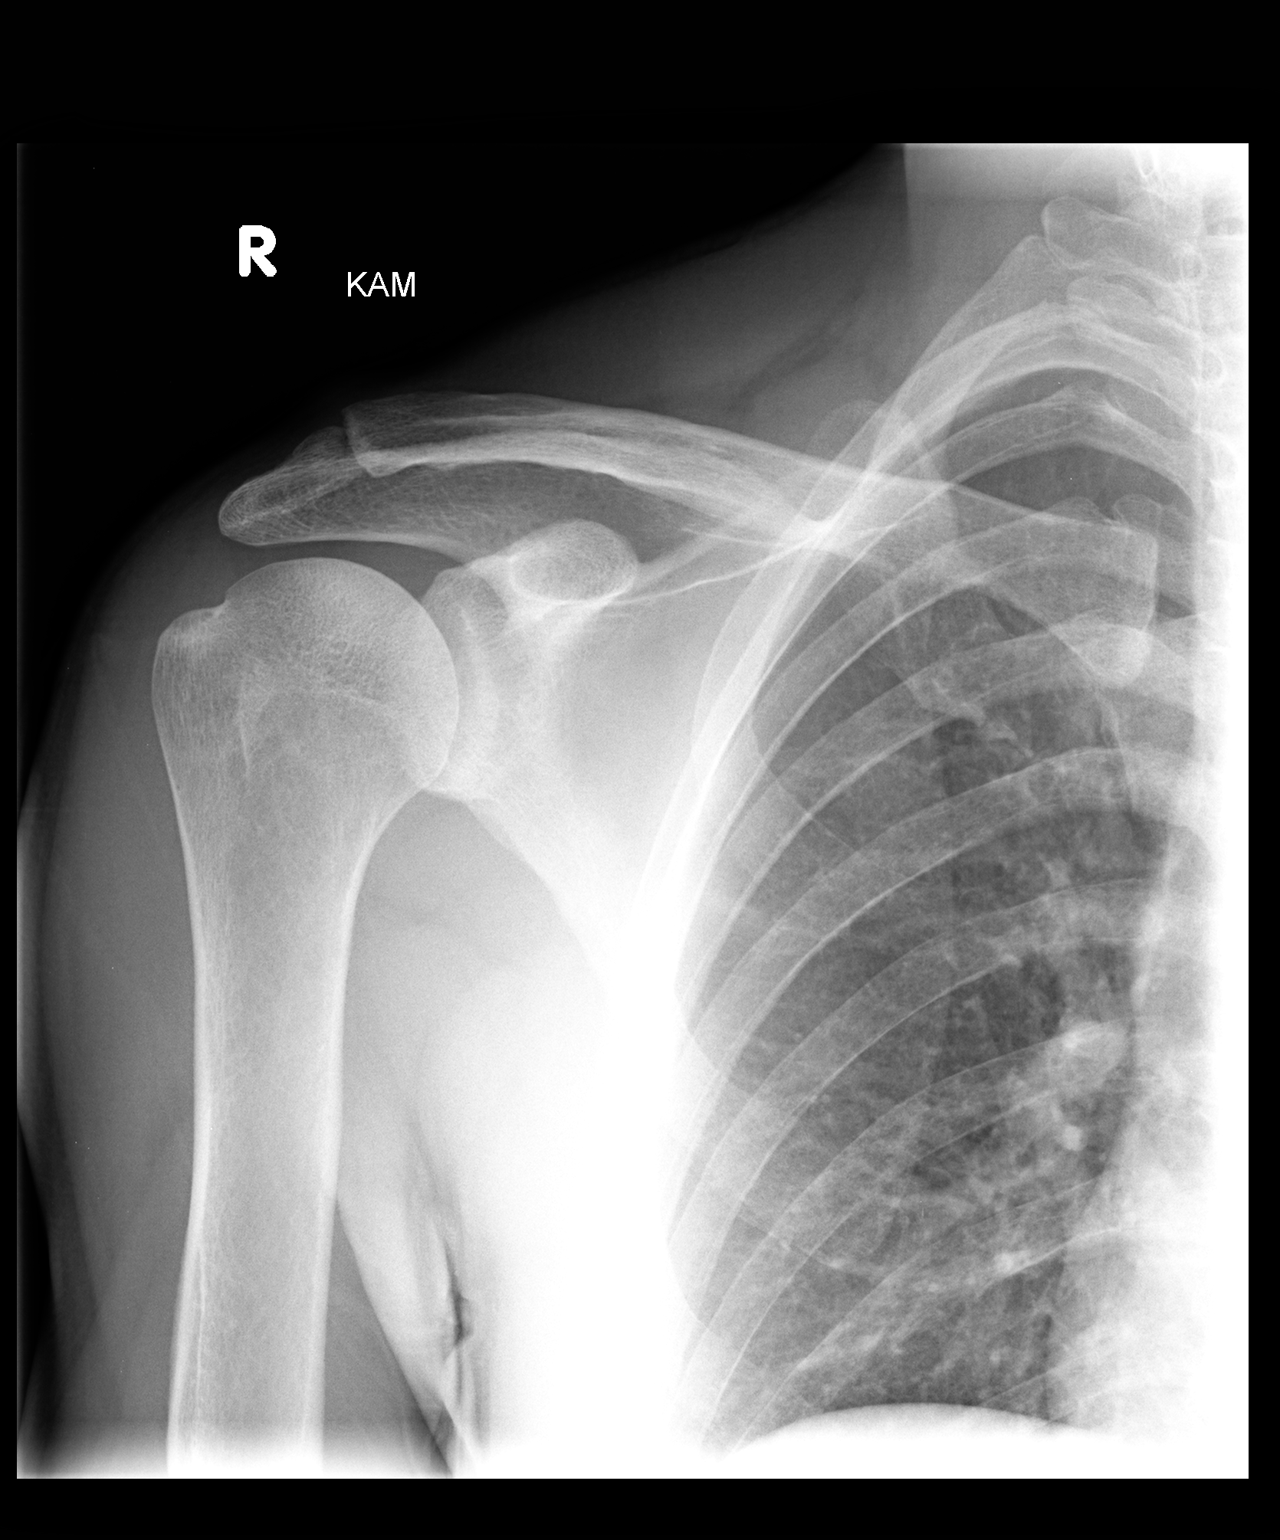

[view not recorded (3 of 3)]
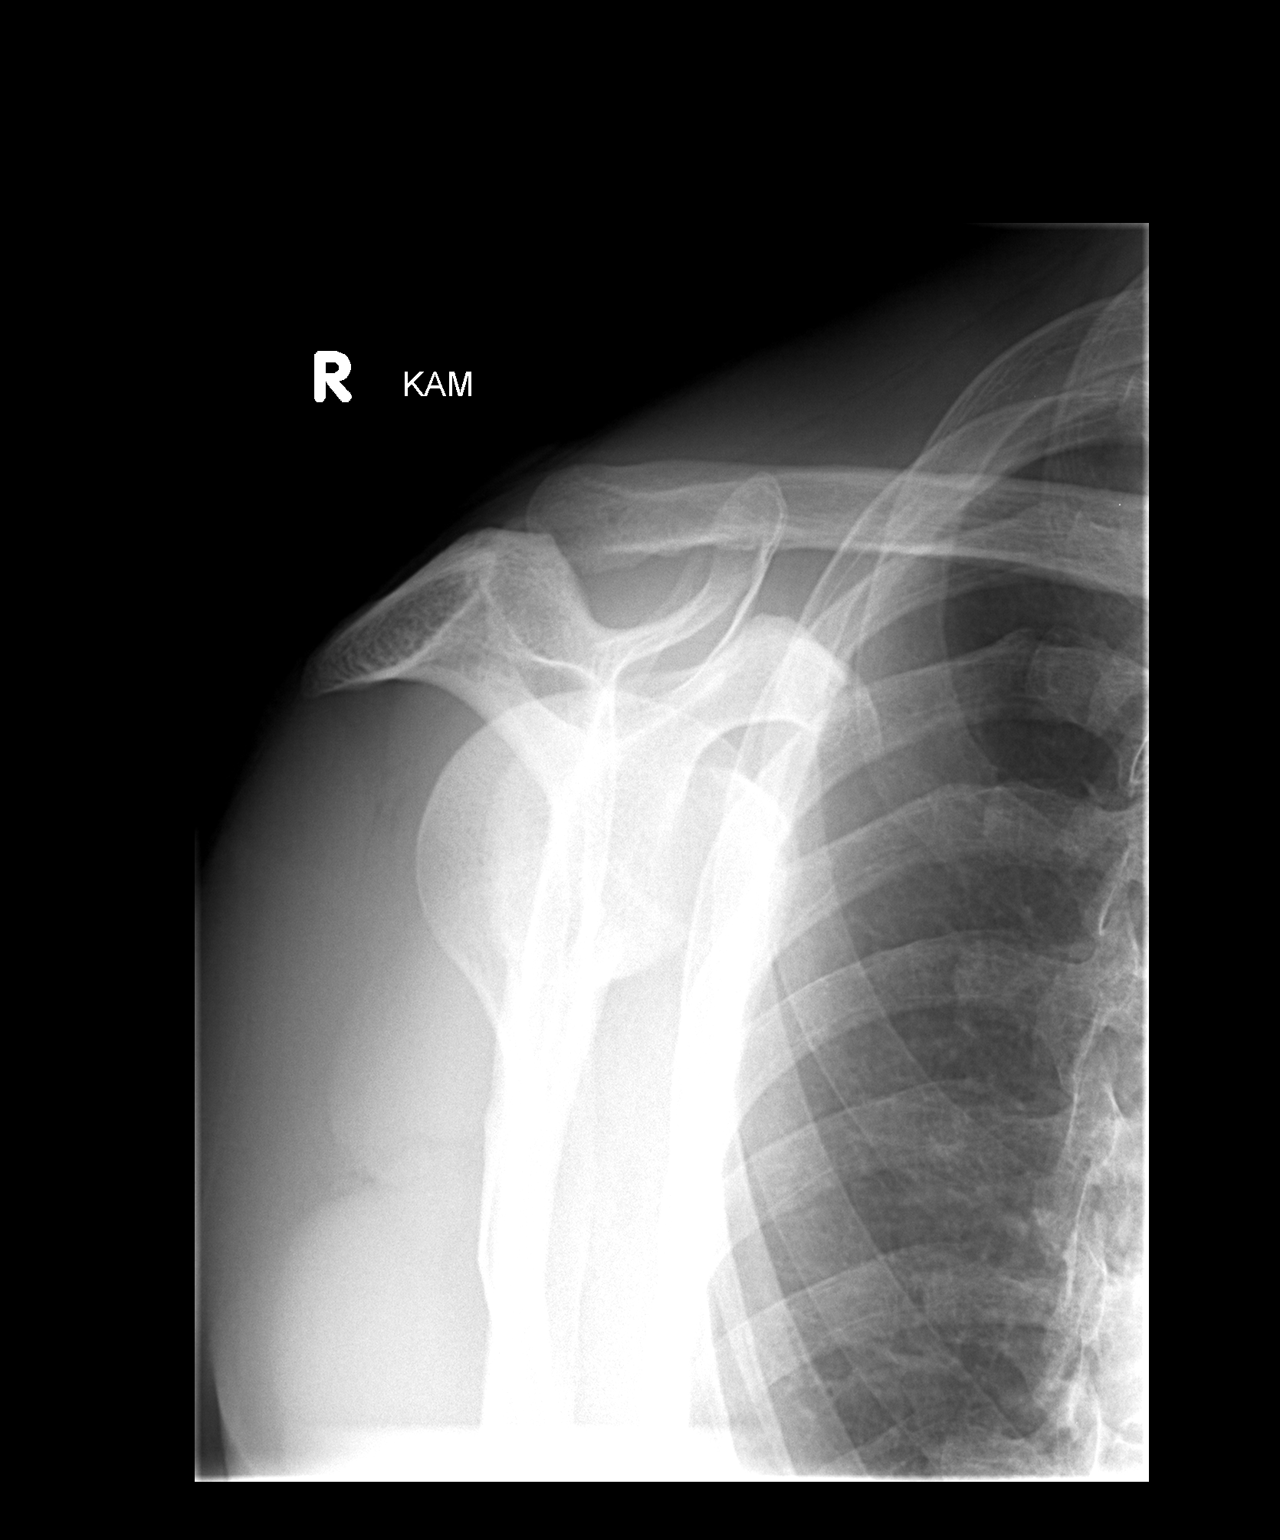

[3 of 3 positions shown; findings below may reference images not displayed]

FINDINGS: No acute fracture.  No dislocation.  Minimal degenerative
change of the AC joint without prominent inferior osteophytes.
Slight lateral downsloping of the acromion.
IMPRESSION: No acute bony pathology.  Mild chronic changes.

## 2017-04-16 DIAGNOSIS — Z0000 Encounter for general adult medical examination without abnormal findings: Secondary | ICD-10-CM | POA: Diagnosis not present

## 2017-04-16 DIAGNOSIS — Z125 Encounter for screening for malignant neoplasm of prostate: Secondary | ICD-10-CM | POA: Diagnosis not present

## 2018-04-22 DIAGNOSIS — Z125 Encounter for screening for malignant neoplasm of prostate: Secondary | ICD-10-CM | POA: Diagnosis not present

## 2018-04-22 DIAGNOSIS — Z0000 Encounter for general adult medical examination without abnormal findings: Secondary | ICD-10-CM | POA: Diagnosis not present

## 2019-04-13 DIAGNOSIS — Z20828 Contact with and (suspected) exposure to other viral communicable diseases: Secondary | ICD-10-CM | POA: Diagnosis not present

## 2019-04-28 DIAGNOSIS — Z0000 Encounter for general adult medical examination without abnormal findings: Secondary | ICD-10-CM | POA: Diagnosis not present

## 2019-09-26 DIAGNOSIS — E109 Type 1 diabetes mellitus without complications: Secondary | ICD-10-CM | POA: Diagnosis not present

## 2019-10-18 DIAGNOSIS — Z20822 Contact with and (suspected) exposure to covid-19: Secondary | ICD-10-CM | POA: Diagnosis not present

## 2020-04-12 DIAGNOSIS — Z0000 Encounter for general adult medical examination without abnormal findings: Secondary | ICD-10-CM | POA: Diagnosis not present

## 2020-08-08 ENCOUNTER — Other Ambulatory Visit (HOSPITAL_COMMUNITY): Payer: Self-pay | Admitting: Emergency Medicine

## 2020-08-20 ENCOUNTER — Ambulatory Visit (HOSPITAL_COMMUNITY)
Admission: RE | Admit: 2020-08-20 | Discharge: 2020-08-20 | Disposition: A | Discharge status: Home / Self Care | Payer: Self-pay | Source: Ambulatory Visit | Attending: Cardiology | Admitting: Cardiology

## 2020-08-20 ENCOUNTER — Other Ambulatory Visit: Payer: Self-pay

## 2022-09-27 IMAGING — CT CT CARDIAC CORONARY ARTERY CALCIUM SCORE
3 series · 14 of 20 positions shown, 15 images · non-contrast
Comparison: None.
COMPARISON: None.

Addendum:
EXAM:
OVER-READ INTERPRETATION  CT CHEST

The following report is an over-read performed by radiologist Dr.
Ronaile Ramjust [REDACTED] on 08/20/2020. This over-read
does not include interpretation of cardiac or coronary anatomy or
pathology. The coronary calcium score interpretation by the
cardiologist is attached.
CLINICAL DATA: Cardiovascular Disease Risk stratification
Coronary Calcium Score
TECHNIQUE: A gated, non-contrast computed tomography scan of the heart was
performed using 3mm slice thickness. Axial images were analyzed on a
dedicated workstation. Calcium scoring of the coronary arteries was
performed using the Agatston method.

[Series 3: ax ca scr 70% (id) · axial · 0.39mm/px · z∈[+1079,+1199]mm · 6 of 86 slices shown]
[im 13/86  vessel]
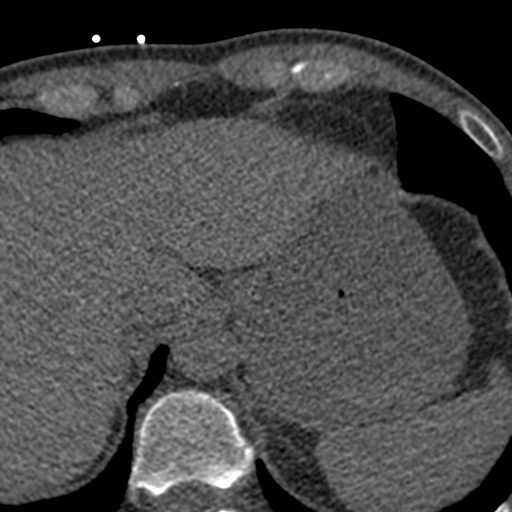
[im 25/86  vessel]
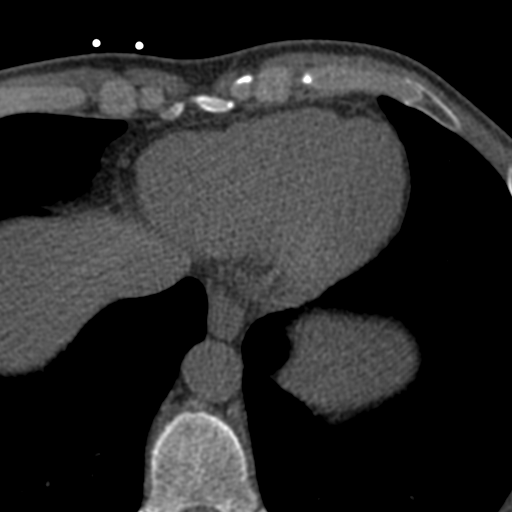
[im 37/86  vessel]
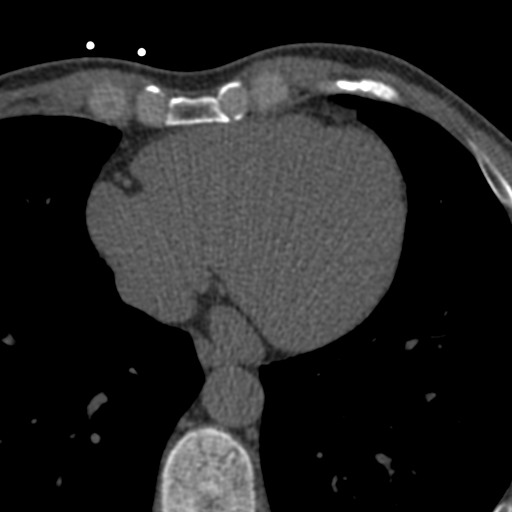
[im 49/86  vessel]
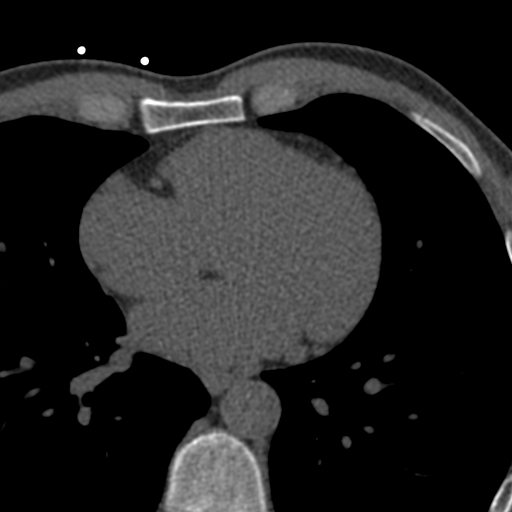
[im 61/86  vessel]
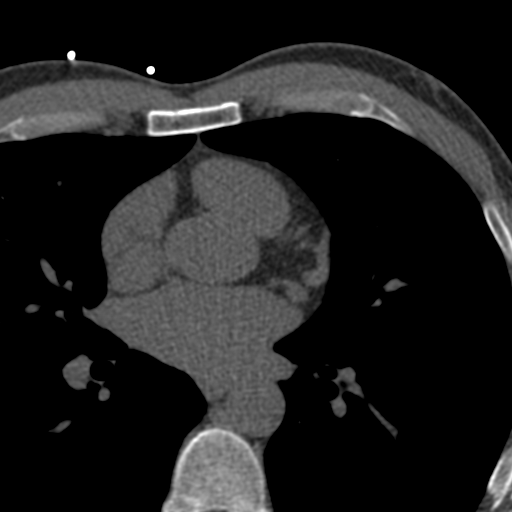
[im 73/86  vessel]
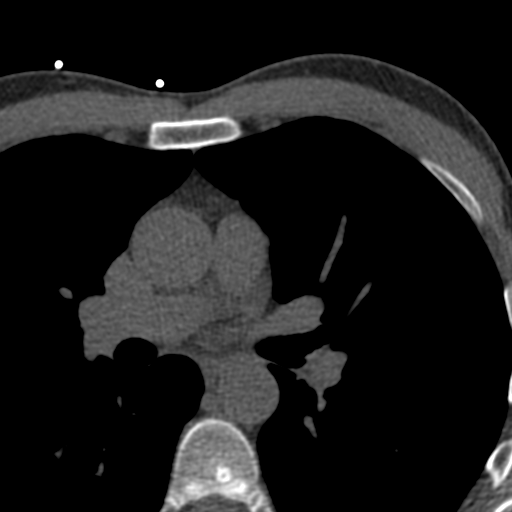

[Series 4: ax st · axial · 0.71mm/px · z∈[+1090,+1188]mm · 4 of 57 slices shown, 5 images]
[im 12/57  vessel]
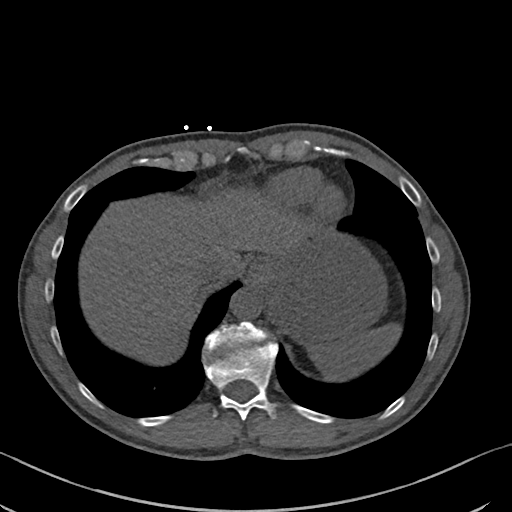
[im 12/57  lung]
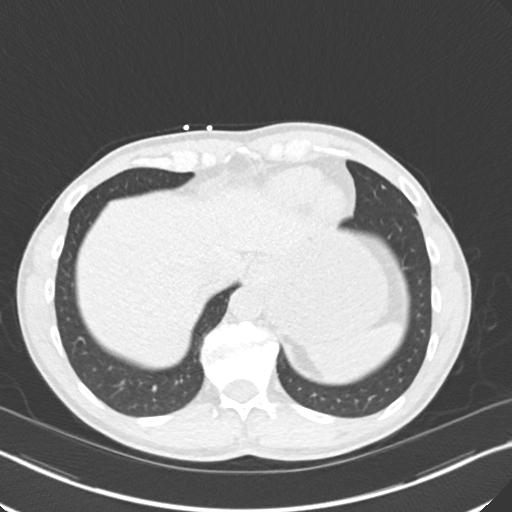
[im 23/57  vessel]
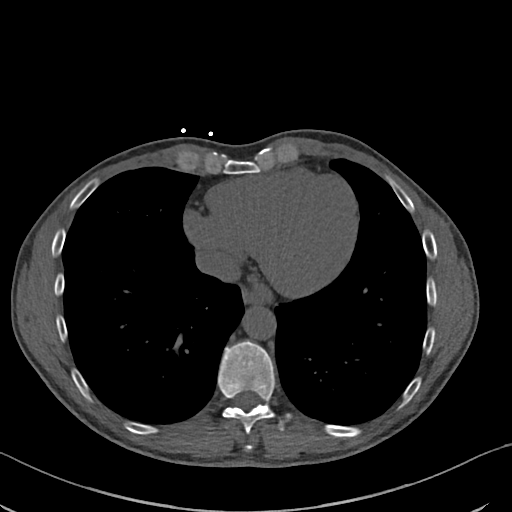
[im 34/57  vessel]
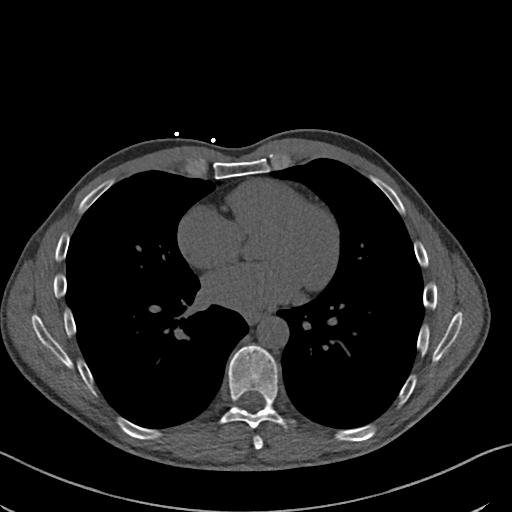
[im 45/57  vessel]
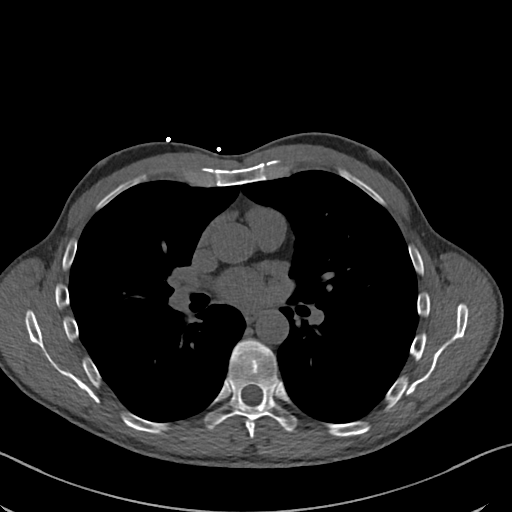

[Series 5: ax lung · axial · 0.71mm/px · z∈[+1090,+1188]mm · 4 of 57 slices shown]
[im 12/57  lung]
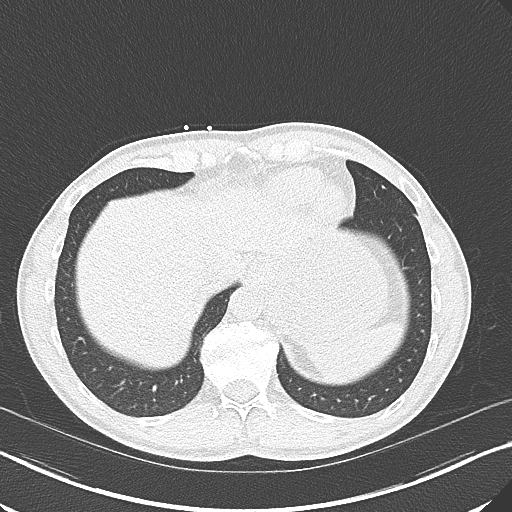
[im 23/57  lung]
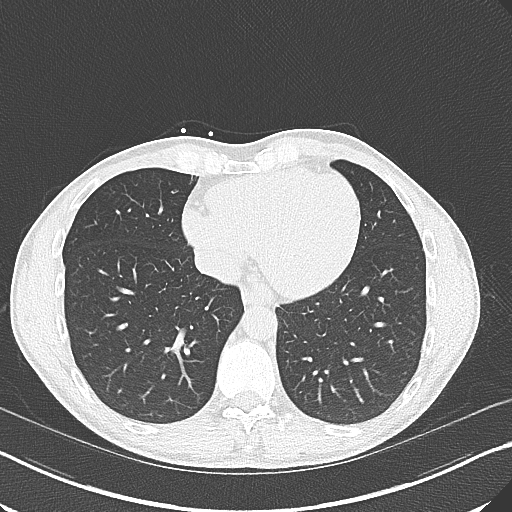
[im 34/57  lung]
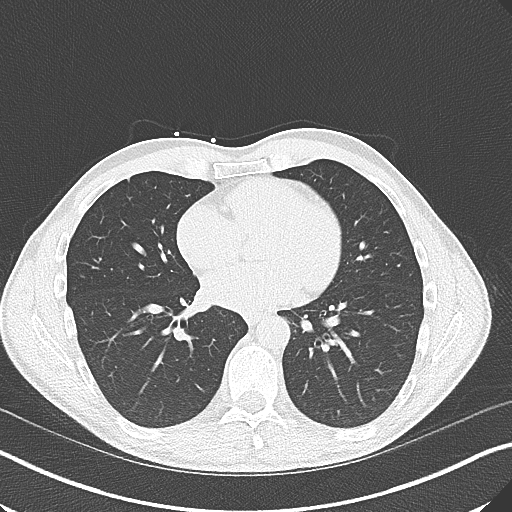
[im 45/57  lung]
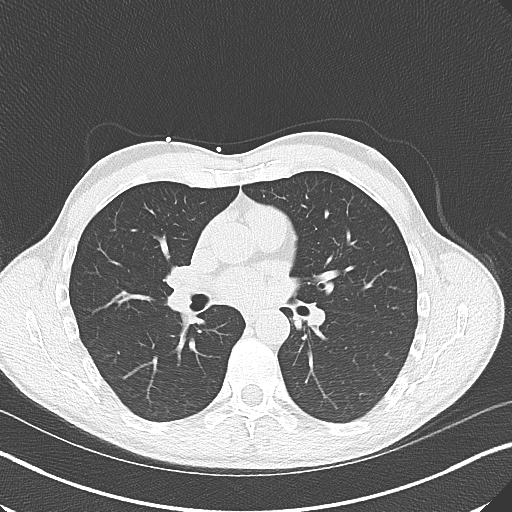

[14 of 20 positions shown; findings below may reference images not displayed]

FINDINGS: Vascular: Normal caliber of the visualized thoracic aorta. No
significant pericardial fluid.

Mediastinum/Nodes: Normal appearance of the visualized mediastinal
structures.

Lungs/Pleura: No large pleural effusions. Visualized lungs are
clear.

Upper Abdomen: Images of the upper abdomen are unremarkable.

Musculoskeletal: Degenerative changes in the visualized thoracic
spine.
IMPRESSION: No acute abnormality involving the extracardiac structures.
FINDINGS: Coronary arteries: Normal origins.

Coronary Calcium Score:

Left main: 0

Left anterior descending artery: 0

Left circumflex artery: 0

Right coronary artery: 0

Total: 0

Percentile: 0

Pericardium: Normal.

Aorta: Normal caliber of ascending aorta. No aortic atherosclerosis
noted.

Non-cardiac: See separate report from [REDACTED].
IMPRESSION: Coronary calcium score of 0. This was 0 percentile for age-, race-,
and sex-matched controls.



If CAC=0, it is reasonable to withhold statin therapy and reassess
in 5 to 10 years, as long as higher risk conditions are absent
(diabetes mellitus, family history of premature CHD in first degree
relatives (males <55 years; females <65 years), cigarette smoking,
or LDL >=190 mg/dL).

If CAC is 1 to 99, it is reasonable to initiate statin therapy for
patients >=55 years of age.

If CAC is >=100 or >=75th percentile, it is reasonable to initiate
statin therapy at any age.

Cardiology referral should be considered for patients with CAC
scores >=400 or >=75th percentile.

*2835 AHA/ACC/AACVPR/AAPA/ABC/ELAINE/YOCELIN/DONALD/[place]/MEDENICA/NAZARETH/NAWAIM
Guideline on the Management of Blood Cholesterol: A Report of the
American College of Cardiology/American Heart Association Task Force
on Clinical Practice Guidelines. J Am Coll Cardiol.
9267;73(24):0352-0507.

*** End of Addendum ***
EXAM:
OVER-READ INTERPRETATION  CT CHEST

The following report is an over-read performed by radiologist Dr.
Ronaile Ramjust [REDACTED] on 08/20/2020. This over-read
does not include interpretation of cardiac or coronary anatomy or
pathology. The coronary calcium score interpretation by the
cardiologist is attached.
FINDINGS: Vascular: Normal caliber of the visualized thoracic aorta. No
significant pericardial fluid.

Mediastinum/Nodes: Normal appearance of the visualized mediastinal
structures.

Lungs/Pleura: No large pleural effusions. Visualized lungs are
clear.

Upper Abdomen: Images of the upper abdomen are unremarkable.

Musculoskeletal: Degenerative changes in the visualized thoracic
spine.
IMPRESSION: No acute abnormality involving the extracardiac structures.

## 2023-08-19 ENCOUNTER — Other Ambulatory Visit: Payer: Self-pay | Admitting: Medical Genetics

## 2023-08-23 ENCOUNTER — Other Ambulatory Visit: Payer: Self-pay

## 2023-08-23 DIAGNOSIS — Z006 Encounter for examination for normal comparison and control in clinical research program: Secondary | ICD-10-CM

## 2023-08-29 LAB — GENECONNECT MOLECULAR SCREEN: Genetic Analysis Overall Interpretation: NEGATIVE
